# Patient Record
Sex: Female | Born: 1935 | Race: White | Hispanic: No | Smoking: Never smoker
Health system: Southern US, Community
[De-identification: ages and names within clinical notes are randomized; demographics above are authoritative.]

## PROBLEM LIST (undated history)

## (undated) DIAGNOSIS — M810 Age-related osteoporosis without current pathological fracture: Secondary | ICD-10-CM

## (undated) DIAGNOSIS — I5022 Chronic systolic (congestive) heart failure: Secondary | ICD-10-CM

## (undated) DIAGNOSIS — I10 Essential (primary) hypertension: Secondary | ICD-10-CM

## (undated) DIAGNOSIS — E785 Hyperlipidemia, unspecified: Secondary | ICD-10-CM

## (undated) HISTORY — DX: Age-related osteoporosis without current pathological fracture: M81.0

## (undated) HISTORY — DX: Essential (primary) hypertension: I10

## (undated) HISTORY — DX: Chronic systolic (congestive) heart failure: I50.22

## (undated) HISTORY — DX: Hyperlipidemia, unspecified: E78.5

---

## 2016-09-24 DIAGNOSIS — I10 Essential (primary) hypertension: Secondary | ICD-10-CM | POA: Diagnosis not present

## 2016-09-24 DIAGNOSIS — Z79899 Other long term (current) drug therapy: Secondary | ICD-10-CM | POA: Diagnosis not present

## 2017-02-08 DIAGNOSIS — H524 Presbyopia: Secondary | ICD-10-CM | POA: Diagnosis not present

## 2017-02-12 DIAGNOSIS — R600 Localized edema: Secondary | ICD-10-CM | POA: Diagnosis not present

## 2017-03-12 DIAGNOSIS — I10 Essential (primary) hypertension: Secondary | ICD-10-CM | POA: Diagnosis not present

## 2017-03-12 DIAGNOSIS — Z79899 Other long term (current) drug therapy: Secondary | ICD-10-CM | POA: Diagnosis not present

## 2017-03-12 DIAGNOSIS — R600 Localized edema: Secondary | ICD-10-CM | POA: Diagnosis not present

## 2017-03-25 ENCOUNTER — Other Ambulatory Visit: Payer: Self-pay | Admitting: Geriatric Medicine

## 2017-03-25 DIAGNOSIS — I509 Heart failure, unspecified: Secondary | ICD-10-CM

## 2017-03-25 DIAGNOSIS — I10 Essential (primary) hypertension: Secondary | ICD-10-CM | POA: Diagnosis not present

## 2017-03-25 DIAGNOSIS — Z79899 Other long term (current) drug therapy: Secondary | ICD-10-CM | POA: Diagnosis not present

## 2017-04-08 ENCOUNTER — Other Ambulatory Visit (HOSPITAL_COMMUNITY): Payer: Self-pay

## 2017-04-15 ENCOUNTER — Ambulatory Visit (HOSPITAL_COMMUNITY): Payer: Medicare HMO | Attending: Geriatric Medicine

## 2017-04-15 ENCOUNTER — Other Ambulatory Visit: Payer: Self-pay

## 2017-04-15 DIAGNOSIS — E785 Hyperlipidemia, unspecified: Secondary | ICD-10-CM | POA: Insufficient documentation

## 2017-04-15 DIAGNOSIS — I080 Rheumatic disorders of both mitral and aortic valves: Secondary | ICD-10-CM | POA: Insufficient documentation

## 2017-04-15 DIAGNOSIS — I110 Hypertensive heart disease with heart failure: Secondary | ICD-10-CM | POA: Diagnosis not present

## 2017-04-15 DIAGNOSIS — I509 Heart failure, unspecified: Secondary | ICD-10-CM | POA: Diagnosis present

## 2017-05-03 DIAGNOSIS — Z23 Encounter for immunization: Secondary | ICD-10-CM | POA: Diagnosis not present

## 2017-05-03 DIAGNOSIS — I10 Essential (primary) hypertension: Secondary | ICD-10-CM | POA: Diagnosis not present

## 2017-05-03 DIAGNOSIS — I272 Pulmonary hypertension, unspecified: Secondary | ICD-10-CM | POA: Diagnosis not present

## 2017-05-03 DIAGNOSIS — Z1389 Encounter for screening for other disorder: Secondary | ICD-10-CM | POA: Diagnosis not present

## 2017-05-03 DIAGNOSIS — H6123 Impacted cerumen, bilateral: Secondary | ICD-10-CM | POA: Diagnosis not present

## 2017-05-03 DIAGNOSIS — Z0000 Encounter for general adult medical examination without abnormal findings: Secondary | ICD-10-CM | POA: Diagnosis not present

## 2017-05-03 DIAGNOSIS — Z79899 Other long term (current) drug therapy: Secondary | ICD-10-CM | POA: Diagnosis not present

## 2017-05-03 DIAGNOSIS — I5022 Chronic systolic (congestive) heart failure: Secondary | ICD-10-CM | POA: Diagnosis not present

## 2017-05-03 DIAGNOSIS — B351 Tinea unguium: Secondary | ICD-10-CM | POA: Diagnosis not present

## 2017-05-04 ENCOUNTER — Telehealth: Payer: Self-pay | Admitting: Cardiology

## 2017-05-04 NOTE — Telephone Encounter (Signed)
Received records from [facility] for appointment on 05/13/17 with Kerin Ransom, [place].  Records put with Luke's schedule for 05/13/17. lp

## 2017-05-10 ENCOUNTER — Other Ambulatory Visit: Payer: Self-pay

## 2017-05-10 DIAGNOSIS — E785 Hyperlipidemia, unspecified: Secondary | ICD-10-CM | POA: Insufficient documentation

## 2017-05-10 DIAGNOSIS — I10 Essential (primary) hypertension: Secondary | ICD-10-CM | POA: Insufficient documentation

## 2017-05-10 DIAGNOSIS — I5022 Chronic systolic (congestive) heart failure: Secondary | ICD-10-CM | POA: Insufficient documentation

## 2017-05-10 DIAGNOSIS — E7800 Pure hypercholesterolemia, unspecified: Secondary | ICD-10-CM

## 2017-05-13 ENCOUNTER — Ambulatory Visit (INDEPENDENT_AMBULATORY_CARE_PROVIDER_SITE_OTHER): Payer: Medicare HMO | Admitting: Cardiology

## 2017-05-13 ENCOUNTER — Encounter: Payer: Self-pay | Admitting: Cardiology

## 2017-05-13 VITALS — BP 180/74 | HR 81 | Ht 61.5 in | Wt 99.2 lb

## 2017-05-13 DIAGNOSIS — I5022 Chronic systolic (congestive) heart failure: Secondary | ICD-10-CM | POA: Diagnosis not present

## 2017-05-13 DIAGNOSIS — Z79899 Other long term (current) drug therapy: Secondary | ICD-10-CM | POA: Diagnosis not present

## 2017-05-13 DIAGNOSIS — I10 Essential (primary) hypertension: Secondary | ICD-10-CM | POA: Diagnosis not present

## 2017-05-13 LAB — BASIC METABOLIC PANEL
BUN/Creatinine Ratio: 12 (ref 12–28)
BUN: 15 mg/dL (ref 8–27)
CO2: 27 mmol/L (ref 20–29)
Calcium: 9.6 mg/dL (ref 8.7–10.3)
Chloride: 97 mmol/L (ref 96–106)
Creatinine, Ser: 1.24 mg/dL — ABNORMAL HIGH (ref 0.57–1.00)
GFR calc Af Amer: 45 mL/min/{1.73_m2} — ABNORMAL LOW (ref >59)
GFR calc non Af Amer: 39 mL/min/{1.73_m2} — ABNORMAL LOW (ref >59)
Glucose: 105 mg/dL — ABNORMAL HIGH (ref 65–99)
Potassium: 4.9 mmol/L (ref 3.5–5.2)
Sodium: 139 mmol/L (ref 134–144)

## 2017-05-13 MED ORDER — CARVEDILOL 3.125 MG PO TABS
3.1250 mg | ORAL_TABLET | Freq: Two times a day (BID) | ORAL | 3 refills | Status: DC
Start: 2017-05-13 — End: 2018-04-30

## 2017-05-13 NOTE — Assessment & Plan Note (Signed)
Needs better control

## 2017-05-13 NOTE — Patient Instructions (Addendum)
Medication Instructions: START Carvedilol 3.125 mg tablet two times daily.  Please have the following lab drawn today: BMET   Procedures/Testing: Your physician has requested that you have an echocardiogram. Echocardiography is a painless test that uses sound waves to create images of your heart. It provides your doctor with information about the size and shape of your heart and how well your heart's chambers and valves are working. This procedure takes approximately one hour. There are no restrictions for this procedure. This will be done at [address]   Follow-Up: Your physician wants you to follow-up in: 3 months with Dr. Debara Pickett (after the echo has been done.) You will receive a reminder letter in the mail two months in advance. If you don't receive a letter, please call our office to schedule the follow-up appointment.    If you need a refill on your cardiac medications before your next appointment, please call your pharmacy.

## 2017-05-13 NOTE — Progress Notes (Signed)
05/13/2017 Dawn Burke   06-10-1926  [id number]  Primary Physician Lajean Manes, MD Primary Cardiologist: Dr Debara Pickett (new)  HPI:  Pleasant 83 y/o female here with her daughter as an referral from Dr Felipa Eth for a new diagnosis of CHF. The pt's PMH is remarkable for a lack of significant medical problems. She has had no surgery and denies any medical problems besides a history of HTN. She moved to this area in 2014 from [place]. She lives in a senior apartment in a retirement community. She had complianed of swelling in her legs a few months ago. She was placed on Lasix 20 mg which helped. An echo was done 04/16/17 which revealed an EF of 30-35% with moderate AR, mild LEA, and global HK. She denies any chest pain, DOE, orthopnea, or PND.    Current Outpatient Prescriptions  Medication Sig Dispense Refill  . aspirin EC 81 MG tablet Take 81 mg by mouth daily.    . furosemide (LASIX) 20 MG tablet Take 20 mg by mouth daily.    . sacubitril-valsartan (ENTRESTO) 24-26 MG Take 1 tablet by mouth 2 (two) times daily.    . carvedilol (COREG) 3.125 MG tablet Take 1 tablet (3.125 mg total) by mouth 2 (two) times daily. 180 tablet 3   No current facility-administered medications for this visit.     No Known Allergies  Past Medical History:  Diagnosis Date  . Chronic systolic CHF (congestive heart failure) ([place])   . Hyperlipidemia   . Hypertension   . Osteoporosis     Social History   Social History  . Marital status: Widowed    Spouse name: N/A  . Number of children: N/A  . Years of education: N/A   Occupational History  . Not on file.   Social History Main Topics  . Smoking status: Never Smoker  . Smokeless tobacco: Never Used  . Alcohol use Yes  . Drug use: Unknown  . Sexual activity: Not on file   Other Topics Concern  . Not on file   Social History Narrative   Single, widowed in 2002. Unemployed, home maker.    3 children. Pt moved from [place] in 2014.      Family History  Problem Relation Age of Onset  . Diabetes Mother   . CAD Father   . CAD Brother   . Diabetes Brother      Review of Systems: General: negative for chills, fever, night sweats or weight changes.  Cardiovascular: negative for chest pain, dyspnea on exertion, orthopnea, palpitations, paroxysmal nocturnal dyspnea or shortness of breath Dermatological: negative for rash Respiratory: negative for cough or wheezing Urologic: negative for hematuria Abdominal: negative for nausea, vomiting, diarrhea, bright red blood per rectum, melena, or hematemesis Neurologic: negative for visual changes, syncope, or dizziness All other systems reviewed and are otherwise negative except as noted above.    Blood pressure (!) 180/74, pulse 81, height 5' 1.5" (1.562 m), weight 99 lb 3.2 oz (45 kg).  General appearance: alert, cooperative, appears stated age and no distress Neck: no carotid bruit and no JVD Lungs: clear to auscultation bilaterally Heart: regular rate and rhythm Abdomen: soft, non-tender; bowel sounds normal; no masses,  no organomegaly Extremities: trace LE edema Pulses: 2+ and symmetric Skin: Skin color, texture, turgor normal. No rashes or lesions Neurologic: Grossly normal  EKG NSR, LVH with repol changes  ASSESSMENT AND PLAN:   Chronic systolic (congestive) heart failure (HCC) Pt noted to have LE edema, echo done 04/15/17  showed her EF to be 30-35% with global HK Currently stable on Lasix and Entresto  Essential hypertension Needs better control   PLAN  I suspect her CM is from uncontrolled HTN. Discussed with Dr Debara Pickett in the office today. Dr Felipa Eth has added Entresto at his LOV. She is stable from a heart failure standpoint. There is no history of angina. For now we will continue medical Rx. Add Coreg 3.125 mg BID. Check BMP today (K+ was 5.6 on 05/04/17 labs). We will check an echo in 3 months- if no improvement will discuss ischemic work up then. I  discussed low sodium diet- pt admits she loves hot dogs and pretzels.   Kerin Ransom PA-C 05/13/2017 9:52 AM

## 2017-05-13 NOTE — Assessment & Plan Note (Signed)
Pt noted to have LE edema, echo done 04/15/17 showed her EF to be 30-35% with global HK Currently stable on Lasix and Entresto

## 2017-05-24 ENCOUNTER — Ambulatory Visit (INDEPENDENT_AMBULATORY_CARE_PROVIDER_SITE_OTHER): Payer: Medicare HMO | Admitting: Podiatry

## 2017-05-24 ENCOUNTER — Encounter: Payer: Self-pay | Admitting: Podiatry

## 2017-05-24 DIAGNOSIS — M79675 Pain in left toe(s): Secondary | ICD-10-CM

## 2017-05-24 DIAGNOSIS — M79674 Pain in right toe(s): Secondary | ICD-10-CM

## 2017-05-24 DIAGNOSIS — B351 Tinea unguium: Secondary | ICD-10-CM | POA: Diagnosis not present

## 2017-05-24 NOTE — Progress Notes (Signed)
   Subjective:    Patient ID: Dawn Burke, female    DOB: [birth year], 83 y.o.   MRN: [id number]  HPI  Chief Complaint  Patient presents with  . Nail Problem    trim nails   Dawn Burke Presents the office if her concerns of very elongated, thick toenails that have not been trimmed and a couple years. She is unsure of last time she had her nails trimmed. Nails are very long and they're causing pressure and she cannot wear shoes because of this. Denies any redness or drainage or any swelling. She denies any open sores. She has no other concerns today.     Review of Systems  Cardiovascular: Positive for leg swelling.  Musculoskeletal: Positive for back pain.  All other systems reviewed and are negative.      Objective:   Physical Exam  General: NAD  Dermatological: Nails are hypertrophic, dystrophic, brittle, discolored, elongated 10. The nails are very elongated and curving in on themselves and the other nails. No surrounding redness or drainage. Tenderness nails 1-5 bilaterally. No open lesions or pre-ulcerative lesions are identified today.  Vascular: Dorsalis Pedis artery and Posterior Tibial artery pedal pulses are 2/4 bilateral with immedate capillary fill time. There is no pain with calf compression, swelling, warmth, erythema.   Neruologic: Grossly intact via light touch bilateral.  Protective threshold with Semmes Wienstein monofilament intact to all pedal sites bilateral.   Musculoskeletal: No gross boney pedal deformities bilateral. No pain, crepitus, or limitation noted with foot and ankle range of motion bilateral. Muscular strength 5/5 in all groups tested bilateral.  Gait: Unassisted, Nonantalgic.      Assessment & Plan:  83 year old female with significant symptomatic onychomycosis -Treatment options discussed including all alternatives, risks, and complications -Etiology of symptoms were discussed -Nails debrided 10 without complications or bleeding. -Daily  foot inspection -Follow-up in 3 months or sooner if any problems arise. In the meantime, encouraged to call the office with any questions, concerns, change in symptoms.   Celesta Gentile, DPM

## 2017-05-26 ENCOUNTER — Other Ambulatory Visit (HOSPITAL_COMMUNITY): Payer: Medicare HMO

## 2017-06-07 DIAGNOSIS — Z79899 Other long term (current) drug therapy: Secondary | ICD-10-CM | POA: Diagnosis not present

## 2017-06-07 DIAGNOSIS — I10 Essential (primary) hypertension: Secondary | ICD-10-CM | POA: Diagnosis not present

## 2017-06-07 DIAGNOSIS — I5022 Chronic systolic (congestive) heart failure: Secondary | ICD-10-CM | POA: Diagnosis not present

## 2017-07-19 DIAGNOSIS — I272 Pulmonary hypertension, unspecified: Secondary | ICD-10-CM | POA: Diagnosis not present

## 2017-07-19 DIAGNOSIS — Z23 Encounter for immunization: Secondary | ICD-10-CM | POA: Diagnosis not present

## 2017-07-19 DIAGNOSIS — Z79899 Other long term (current) drug therapy: Secondary | ICD-10-CM | POA: Diagnosis not present

## 2017-07-19 DIAGNOSIS — I5022 Chronic systolic (congestive) heart failure: Secondary | ICD-10-CM | POA: Diagnosis not present

## 2017-07-19 DIAGNOSIS — I10 Essential (primary) hypertension: Secondary | ICD-10-CM | POA: Diagnosis not present

## 2017-07-26 ENCOUNTER — Ambulatory Visit (HOSPITAL_COMMUNITY): Payer: Medicare HMO

## 2017-08-11 ENCOUNTER — Other Ambulatory Visit: Payer: Self-pay

## 2017-08-11 ENCOUNTER — Ambulatory Visit (HOSPITAL_COMMUNITY): Payer: Medicare HMO | Attending: Cardiology

## 2017-08-11 DIAGNOSIS — I110 Hypertensive heart disease with heart failure: Secondary | ICD-10-CM | POA: Diagnosis not present

## 2017-08-11 DIAGNOSIS — I5022 Chronic systolic (congestive) heart failure: Secondary | ICD-10-CM | POA: Insufficient documentation

## 2017-08-11 DIAGNOSIS — I083 Combined rheumatic disorders of mitral, aortic and tricuspid valves: Secondary | ICD-10-CM | POA: Diagnosis not present

## 2017-08-11 DIAGNOSIS — E785 Hyperlipidemia, unspecified: Secondary | ICD-10-CM | POA: Diagnosis not present

## 2017-08-18 ENCOUNTER — Encounter: Payer: Self-pay | Admitting: Internal Medicine

## 2017-08-18 ENCOUNTER — Ambulatory Visit (INDEPENDENT_AMBULATORY_CARE_PROVIDER_SITE_OTHER): Payer: Medicare HMO | Admitting: Internal Medicine

## 2017-08-18 VITALS — BP 134/86 | HR 80 | Ht 62.0 in | Wt 102.0 lb

## 2017-08-18 DIAGNOSIS — I5022 Chronic systolic (congestive) heart failure: Secondary | ICD-10-CM

## 2017-08-18 DIAGNOSIS — I10 Essential (primary) hypertension: Secondary | ICD-10-CM

## 2017-08-18 DIAGNOSIS — E785 Hyperlipidemia, unspecified: Secondary | ICD-10-CM | POA: Diagnosis not present

## 2017-08-18 NOTE — Patient Instructions (Signed)
Your physician wants you to follow-up in: 6 months with [place], [place] and 12 months with Dr. Debara Pickett. You will receive a reminder letter in the mail two months in advance. If you don't receive a letter, please call our office to schedule the follow-up appointment.

## 2017-08-18 NOTE — Progress Notes (Signed)
OFFICE NOTE  Chief Complaint:  Follow-up heart failure  Primary Care Physician: Lajean Manes, MD  HPI:  Dawn Burke is a 83 y.o. female with a past medial history significant for congestive heart failure. It is not known whether this is ischemic or nonischemic. She moved with her family to [place] in 2014 from [place]. She lives in a retirement community. In June 2018 and echo showed an EF of 30-35% with moderate aortic insufficiency and global hypokinesis. She was started on medical therapy for that and saw Kerin Ransom, PA-C. He added carvedilol and she is currently on the moderate dose of Entresto. Subsequently she underwent a repeat echo on 08/11/2017 which showed improvement in LVEF to 45-50%. Unfortunate she also has some dementia and does not have much recollection of her symptoms. Overall she says she feels fine when queried about a number of medical problems. She denies shortness of breath or chest pain. She is not very physically active. Overall she is without complaints.  PMHx:  Past Medical History:  Diagnosis Date  . Chronic systolic CHF (congestive heart failure) ([place])   . Hyperlipidemia   . Hypertension   . Osteoporosis     History reviewed. No pertinent surgical history.  FAMHx:  Family History  Problem Relation Age of Onset  . Diabetes Mother   . CAD Father   . CAD Brother   . Diabetes Brother     SOCHx:   reports that she has never smoked. She has never used smokeless tobacco. She reports that she drinks alcohol. Her drug history is not on Burke.  ALLERGIES:  No Known Allergies  ROS: Pertinent items noted in HPI and remainder of comprehensive ROS otherwise negative.  HOME MEDS: Current Outpatient Prescriptions on Burke Prior to Visit  Medication Sig Dispense Refill  . aspirin EC 81 MG tablet Take 81 mg by mouth daily.    . furosemide (LASIX) 20 MG tablet Take 20 mg by mouth daily.    . carvedilol (COREG) 3.125 MG tablet Take 1 tablet (3.125  mg total) by mouth 2 (two) times daily. 180 tablet 3   No current facility-administered medications on Burke prior to visit.     LABS/IMAGING: No results found for this or any previous visit (from the past 48 hour(s)). No results found.  LIPID PANEL: No results found for: CHOL, TRIG, HDL, CHOLHDL, VLDL, LDLCALC, LDLDIRECT   WEIGHTS: Wt Readings from Last 3 Encounters:  08/18/17 102 lb (46.3 kg)  05/13/17 99 lb 3.2 oz (45 kg)    VITALS: BP 134/86   Pulse 80   Ht 5\' 2"  (1.575 m)   Wt 102 lb (46.3 kg)   BMI 18.66 kg/m   EXAM: General appearance: alert and no distress Neck: no carotid bruit, no JVD and thyroid not enlarged, symmetric, no tenderness/mass/nodules Lungs: clear to auscultation bilaterally Heart: regular rate and rhythm, S1, S2 normal and diastolic murmur: early diastolic 2/6, crescendo at 2nd right intercostal space Abdomen: soft, non-tender; bowel sounds normal; no masses,  no organomegaly Extremities: extremities normal, atraumatic, no cyanosis or edema Pulses: 2+ and symmetric Skin: Skin color, texture, turgor normal. No rashes or lesions Neurologic: Grossly normal Psych: Pleasant  EKG: Normal sinus rhythm at 80, moderate voltage criteria for LVH, nonspecific T-wave changes - personally reviewed  ASSESSMENT: 1. Systolic congestive heart failure, LVEF 30-35% (improved to 45-50%) 2. Hyperlipidemia 3. Hypertension 4. Memory loss  PLAN: 1.   Dawn Burke 83 years old and has some mentioned. Overall though she  does pretty well and is generally asymptomatic. She was found to have an EF of 30-35% however is on guideline directed therapy for congestive heart failure and has had improvement in LVEF of 45-50%. She cannot recall any difference in her symptoms with this therapy however has had significant improvement and seems to be tolerating her medications. I continue those and if there is room consider increasing her Entresto to the max dose.  Follow-up with Kerin Ransom, PA-C-C in 6 months and me in one year per  Pixie Casino, MD, [place]  Attending Cardiologist  Direct Dial: [phone]  Fax: [phone]  Website:  [email or web address] Dawn Burke 08/18/2017, 10:57 AM

## 2017-08-23 ENCOUNTER — Encounter: Payer: Self-pay | Admitting: Podiatry

## 2017-08-23 ENCOUNTER — Ambulatory Visit (INDEPENDENT_AMBULATORY_CARE_PROVIDER_SITE_OTHER): Payer: Medicare HMO | Admitting: Podiatry

## 2017-08-23 DIAGNOSIS — M79675 Pain in left toe(s): Secondary | ICD-10-CM | POA: Diagnosis not present

## 2017-08-23 DIAGNOSIS — L02611 Cutaneous abscess of right foot: Secondary | ICD-10-CM | POA: Diagnosis not present

## 2017-08-23 DIAGNOSIS — M79674 Pain in right toe(s): Secondary | ICD-10-CM

## 2017-08-23 DIAGNOSIS — B351 Tinea unguium: Secondary | ICD-10-CM

## 2017-08-23 DIAGNOSIS — L84 Corns and callosities: Secondary | ICD-10-CM | POA: Diagnosis not present

## 2017-08-23 NOTE — Progress Notes (Signed)
Subjective: 83 y.o. returns the office today for painful, elongated, thickened toenails which she cannot trim herself. Denies any redness or drainage around the nails. She also has a callus to her right foot in the ball of her foot. Denies any acute changes since last appointment and no new complaints today. Denies any systemic complaints such as fevers, chills, nausea, vomiting.   Objective: AAO 3, NAD DP/PT pulses palpable, CRT less than 3 seconds Nails hypertrophic, dystrophic, elongated, brittle, discolored 10. There is tenderness overlying the nails 1-5 bilaterally. There is no surrounding erythema or drainage along the nail sites. Right foot second metatarsal for the hyperkeratotic lesion with what appears to be a small little blister around the area. During debridement there is a very small amount of purulence expressed after debridement there is no underlying drainage and stability a superficial abscess. Very small superficial abrasion type wound is present underneath the callus measuring 0.2 x 0.17 m and is no probing, undermining or tunneling. There is no surrounding erythema, ascending cellulitis. There is no fluctuance or crepitus. There is no malodor. No open lesions or pre-ulcerative lesions are identified. No other areas of tenderness bilateral lower extremities. No overlying edema, erythema, increased warmth. No pain with calf compression, swelling, warmth, erythema.  Assessment: Patient presents with symptomatic onychomycosis superficial abscess under callus right foot;   Plan: -Treatment options including alternatives, risks, complications were discussed -Nails sharply debrided 10 without complication/bleeding. -Hyperkeratotic lesion right foot was sharply debrided to reveal small superficial area of purulence. After debridement there was no further area of infection present. Antibiotic ointment and a bandage was applied and recommended her to continue with this as well. I will  see her back in 2 weeks or sooner if any issues are to arise.  Monitor for any clinical signs or symptoms of infection and directed to call the office immediately should any occur or go to the ER. -Discussed daily foot inspection. If there are any changes, to call the office immediately.  -Follow-up in 3 months for routine care or sooner if any problems are to arise. In the meantime, encouraged to call the office with any questions, concerns, changes symptoms.  Celesta Gentile, DPM

## 2017-08-31 DIAGNOSIS — I10 Essential (primary) hypertension: Secondary | ICD-10-CM | POA: Diagnosis not present

## 2017-08-31 DIAGNOSIS — I5022 Chronic systolic (congestive) heart failure: Secondary | ICD-10-CM | POA: Diagnosis not present

## 2017-08-31 DIAGNOSIS — Z79899 Other long term (current) drug therapy: Secondary | ICD-10-CM | POA: Diagnosis not present

## 2017-09-06 ENCOUNTER — Ambulatory Visit: Payer: Medicare HMO | Admitting: Podiatry

## 2017-10-05 ENCOUNTER — Other Ambulatory Visit: Payer: Self-pay | Admitting: Geriatric Medicine

## 2017-10-05 DIAGNOSIS — F0390 Unspecified dementia without behavioral disturbance: Secondary | ICD-10-CM

## 2017-10-05 DIAGNOSIS — I10 Essential (primary) hypertension: Secondary | ICD-10-CM | POA: Diagnosis not present

## 2017-10-05 DIAGNOSIS — G301 Alzheimer's disease with late onset: Secondary | ICD-10-CM | POA: Diagnosis not present

## 2017-10-05 DIAGNOSIS — R69 Illness, unspecified: Secondary | ICD-10-CM | POA: Diagnosis not present

## 2017-10-05 DIAGNOSIS — Z79899 Other long term (current) drug therapy: Secondary | ICD-10-CM | POA: Diagnosis not present

## 2017-10-05 DIAGNOSIS — I5022 Chronic systolic (congestive) heart failure: Secondary | ICD-10-CM | POA: Diagnosis not present

## 2017-10-12 ENCOUNTER — Ambulatory Visit
Admission: RE | Admit: 2017-10-12 | Discharge: 2017-10-12 | Disposition: A | Discharge status: Home / Self Care | Payer: Medicare HMO | Source: Ambulatory Visit | Attending: Geriatric Medicine | Admitting: Geriatric Medicine

## 2017-10-12 DIAGNOSIS — F0390 Unspecified dementia without behavioral disturbance: Secondary | ICD-10-CM

## 2017-10-12 DIAGNOSIS — R410 Disorientation, unspecified: Secondary | ICD-10-CM | POA: Diagnosis not present

## 2017-11-29 ENCOUNTER — Ambulatory Visit: Payer: Medicare HMO | Admitting: Podiatry

## 2017-11-29 DIAGNOSIS — M79675 Pain in left toe(s): Secondary | ICD-10-CM | POA: Diagnosis not present

## 2017-11-29 DIAGNOSIS — M79674 Pain in right toe(s): Secondary | ICD-10-CM | POA: Diagnosis not present

## 2017-11-29 DIAGNOSIS — B351 Tinea unguium: Secondary | ICD-10-CM | POA: Diagnosis not present

## 2017-11-30 NOTE — Progress Notes (Signed)
Subjective: 84 y.o. returns the office today for painful, elongated, thickened toenails which she cannot trim herself. Denies any redness or drainage around the nails.  She states the callus in the right foot looks better he does not need to be trimmed today.  Denies any swelling redness or drainage. Denies any acute changes since last appointment and no new complaints today. Denies any systemic complaints such as fevers, chills, nausea, vomiting.   Objective: AAO 3, NAD DP/PT pulses palpable, CRT less than 3 seconds Nails hypertrophic, dystrophic, elongated, brittle, discolored 10. There is tenderness overlying the nails 1-5 bilaterally. There is no surrounding erythema or drainage along the nail sites. Minimal hyperkeratotic tissue right submetatarsal area.  She does not with the area trimmed today.  No surrounding edema, erythema, drainage or pus or any signs of infection noted today.   No open lesions or other pre-ulcerative lesions are identified. No other areas of tenderness bilateral lower extremities. No overlying edema, erythema, increased warmth. No pain with calf compression, swelling, warmth, erythema.  Assessment: Patient presents with symptomatic onychomycosis  Plan: -Treatment options including alternatives, risks, complications were discussed -Nails sharply debrided 10 without complication/bleeding. -Discussed daily foot inspection. If there are any changes, to call the office immediately.  -Follow-up in 3 months for routine care or sooner if any problems are to arise. In the meantime, encouraged to call the office with any questions, concerns, changes symptoms.  Celesta Gentile, DPM

## 2017-12-31 DIAGNOSIS — N183 Chronic kidney disease, stage 3 (moderate): Secondary | ICD-10-CM | POA: Diagnosis not present

## 2017-12-31 DIAGNOSIS — G301 Alzheimer's disease with late onset: Secondary | ICD-10-CM | POA: Diagnosis not present

## 2017-12-31 DIAGNOSIS — I129 Hypertensive chronic kidney disease with stage 1 through stage 4 chronic kidney disease, or unspecified chronic kidney disease: Secondary | ICD-10-CM | POA: Diagnosis not present

## 2017-12-31 DIAGNOSIS — Z79899 Other long term (current) drug therapy: Secondary | ICD-10-CM | POA: Diagnosis not present

## 2017-12-31 DIAGNOSIS — I5022 Chronic systolic (congestive) heart failure: Secondary | ICD-10-CM | POA: Diagnosis not present

## 2018-02-28 ENCOUNTER — Ambulatory Visit: Payer: Medicare HMO | Admitting: Podiatry

## 2018-03-01 ENCOUNTER — Ambulatory Visit: Payer: Medicare HMO | Admitting: Podiatry

## 2018-03-01 DIAGNOSIS — B351 Tinea unguium: Secondary | ICD-10-CM

## 2018-03-01 DIAGNOSIS — M79675 Pain in left toe(s): Secondary | ICD-10-CM | POA: Diagnosis not present

## 2018-03-01 DIAGNOSIS — L84 Corns and callosities: Secondary | ICD-10-CM

## 2018-03-01 DIAGNOSIS — M79674 Pain in right toe(s): Secondary | ICD-10-CM

## 2018-03-02 NOTE — Progress Notes (Signed)
Subjective: 84 y.o. returns the office today for painful, elongated, thickened toenails which she cannot trim herself. Denies any redness or drainage around the nails. Denies any swelling redness or drainage. Denies any acute changes since last appointment and no new complaints today. Denies any systemic complaints such as fevers, chills, nausea, vomiting.   Objective: AAO 3, NAD DP/PT pulses palpable, CRT less than 3 seconds Nails hypertrophic, dystrophic, elongated, brittle, discolored 10. There is tenderness overlying the nails 1-5 bilaterally. There is no surrounding erythema or drainage along the nail sites. Minimal hyperkeratotic tissue right submetatarsal area.   No surrounding edema, erythema, drainage or pus or any signs of infection noted today.   No open lesions or other pre-ulcerative lesions are identified. No other areas of tenderness bilateral lower extremities. No overlying edema, erythema, increased warmth. No pain with calf compression, swelling, warmth, erythema.  Assessment: Patient presents with symptomatic onychomycosis  Plan: -Treatment options including alternatives, risks, complications were discussed -Nails sharply debrided 10 without complication/bleeding. -Lightly debrided the hyperkeratotic lesion to the any complications or bleeding and I only removed the very superficial area at her request and I did not go deep with it. -Discussed daily foot inspection. If there are any changes, to call the office immediately.  -Follow-up in 3 months for routine care or sooner if any problems are to arise. In the meantime, encouraged to call the office with any questions, concerns, changes symptoms.  Celesta Gentile, DPM

## 2018-04-30 ENCOUNTER — Other Ambulatory Visit: Payer: Self-pay | Admitting: Cardiology

## 2018-05-10 DIAGNOSIS — I129 Hypertensive chronic kidney disease with stage 1 through stage 4 chronic kidney disease, or unspecified chronic kidney disease: Secondary | ICD-10-CM | POA: Diagnosis not present

## 2018-05-10 DIAGNOSIS — Z23 Encounter for immunization: Secondary | ICD-10-CM | POA: Diagnosis not present

## 2018-05-10 DIAGNOSIS — Z1389 Encounter for screening for other disorder: Secondary | ICD-10-CM | POA: Diagnosis not present

## 2018-05-10 DIAGNOSIS — G301 Alzheimer's disease with late onset: Secondary | ICD-10-CM | POA: Diagnosis not present

## 2018-05-10 DIAGNOSIS — R739 Hyperglycemia, unspecified: Secondary | ICD-10-CM | POA: Diagnosis not present

## 2018-05-10 DIAGNOSIS — I10 Essential (primary) hypertension: Secondary | ICD-10-CM | POA: Diagnosis not present

## 2018-05-10 DIAGNOSIS — N183 Chronic kidney disease, stage 3 (moderate): Secondary | ICD-10-CM | POA: Diagnosis not present

## 2018-05-10 DIAGNOSIS — H6120 Impacted cerumen, unspecified ear: Secondary | ICD-10-CM | POA: Diagnosis not present

## 2018-05-10 DIAGNOSIS — Z79899 Other long term (current) drug therapy: Secondary | ICD-10-CM | POA: Diagnosis not present

## 2018-05-10 DIAGNOSIS — I5022 Chronic systolic (congestive) heart failure: Secondary | ICD-10-CM | POA: Diagnosis not present

## 2018-05-10 DIAGNOSIS — Z0000 Encounter for general adult medical examination without abnormal findings: Secondary | ICD-10-CM | POA: Diagnosis not present

## 2018-05-25 DIAGNOSIS — H6123 Impacted cerumen, bilateral: Secondary | ICD-10-CM | POA: Diagnosis not present

## 2018-06-02 ENCOUNTER — Ambulatory Visit: Payer: Medicare HMO | Admitting: Podiatry

## 2018-06-02 DIAGNOSIS — Q828 Other specified congenital malformations of skin: Secondary | ICD-10-CM | POA: Diagnosis not present

## 2018-06-02 DIAGNOSIS — M79674 Pain in right toe(s): Secondary | ICD-10-CM | POA: Diagnosis not present

## 2018-06-02 DIAGNOSIS — M79675 Pain in left toe(s): Secondary | ICD-10-CM | POA: Diagnosis not present

## 2018-06-02 DIAGNOSIS — B351 Tinea unguium: Secondary | ICD-10-CM

## 2018-06-02 NOTE — Progress Notes (Signed)
Subjective: 84 y.o. returns the office today for painful, elongated, thickened toenails which she cannot trim herself. Denies any redness or drainage around the nails. Denies any swelling redness or drainage. Also has calluses on the right foot in the ball and denies any redness, drainage, or signs of infection. Denies any acute changes since last appointment and no new complaints today. Denies any systemic complaints such as fevers, chills, nausea, vomiting.   Objective: AAO 3, NAD DP/PT pulses palpable, CRT less than 3 seconds Nails hypertrophic, dystrophic, elongated, brittle, discolored 10. There is tenderness overlying the nails 1-5 bilaterally. There is no surrounding erythema or drainage along the nail sites. Minimal hyperkeratotic tissue right submetatarsal 2 and 3.   No surrounding edema, erythema, drainage or pus or any signs of infection noted today.   No open lesions or other pre-ulcerative lesions are identified. No other areas of tenderness bilateral lower extremities. No overlying edema, erythema, increased warmth. No pain with calf compression, swelling, warmth, erythema.  Assessment: Patient presents with symptomatic onychomycosis  Plan: -Treatment options including alternatives, risks, complications were discussed -Nails sharply debrided 10 without complication/bleeding. -Debrided the hyperkeratotic lesion to the any complications or bleeding x 2.  -Discussed daily foot inspection. If there are any changes, to call the office immediately.  -Follow-up in 3 months for routine care or sooner if any problems are to arise. In the meantime, encouraged to call the office with any questions, concerns, changes symptoms.  Celesta Gentile, DPM

## 2018-09-02 ENCOUNTER — Ambulatory Visit: Payer: Medicare HMO | Admitting: Podiatry

## 2018-09-02 ENCOUNTER — Encounter: Payer: Self-pay | Admitting: Podiatry

## 2018-09-02 VITALS — BP 142/112 | HR 83 | Resp 16

## 2018-09-02 DIAGNOSIS — Q828 Other specified congenital malformations of skin: Secondary | ICD-10-CM

## 2018-09-02 DIAGNOSIS — M79674 Pain in right toe(s): Secondary | ICD-10-CM | POA: Diagnosis not present

## 2018-09-02 DIAGNOSIS — B351 Tinea unguium: Secondary | ICD-10-CM | POA: Diagnosis not present

## 2018-09-02 DIAGNOSIS — M79675 Pain in left toe(s): Secondary | ICD-10-CM | POA: Diagnosis not present

## 2018-09-04 NOTE — Progress Notes (Signed)
Subjective: 84 y.o. returns the office today for painful, elongated, thickened toenails which she cannot trim herself as well as for calluses. Denies any redness or drainage around the nails/callus is. Denies any swelling redness or drainage.  Denies any acute changes since last appointment and no new complaints today. Denies any systemic complaints such as fevers, chills, nausea, vomiting.   Objective: AAO 3, NAD DP/PT pulses palpable, CRT less than 3 seconds Nails hypertrophic, dystrophic, elongated, brittle, discolored 10. There is tenderness overlying the nails 1-5 bilaterally. There is no surrounding erythema or drainage along the nail sites. Minimal hyperkeratotic tissue right submetatarsal 2 and 3.   No surrounding edema, erythema, drainage or pus or any signs of infection noted today.   No open lesions or other pre-ulcerative lesions are identified. No pain with calf compression, swelling, warmth, erythema. Overall, no significant change  Assessment: Patient presents with symptomatic onychomycosis, hyperkeratotic lesions  Plan: -Treatment options including alternatives, risks, complications were discussed -Nails sharply debrided 10 without complication/bleeding. -Debrided the hyperkeratotic lesion to the any complications or bleeding x 2.  -Discussed daily foot inspection. If there are any changes, to call the office immediately.  -Follow-up in 3 months for routine care or sooner if any problems are to arise. In the meantime, encouraged to call the office with any questions, concerns, changes symptoms.  Celesta Gentile, DPM

## 2018-11-18 DIAGNOSIS — G301 Alzheimer's disease with late onset: Secondary | ICD-10-CM | POA: Diagnosis not present

## 2018-11-18 DIAGNOSIS — Z23 Encounter for immunization: Secondary | ICD-10-CM | POA: Diagnosis not present

## 2018-11-18 DIAGNOSIS — N184 Chronic kidney disease, stage 4 (severe): Secondary | ICD-10-CM | POA: Diagnosis not present

## 2018-11-18 DIAGNOSIS — I129 Hypertensive chronic kidney disease with stage 1 through stage 4 chronic kidney disease, or unspecified chronic kidney disease: Secondary | ICD-10-CM | POA: Diagnosis not present

## 2018-11-18 DIAGNOSIS — N183 Chronic kidney disease, stage 3 (moderate): Secondary | ICD-10-CM | POA: Diagnosis not present

## 2018-11-18 DIAGNOSIS — I5022 Chronic systolic (congestive) heart failure: Secondary | ICD-10-CM | POA: Diagnosis not present

## 2018-12-02 ENCOUNTER — Encounter: Payer: Self-pay | Admitting: Podiatry

## 2018-12-02 ENCOUNTER — Ambulatory Visit: Payer: Medicare HMO | Admitting: Podiatry

## 2018-12-02 DIAGNOSIS — Q828 Other specified congenital malformations of skin: Secondary | ICD-10-CM

## 2018-12-02 DIAGNOSIS — M79675 Pain in left toe(s): Secondary | ICD-10-CM | POA: Diagnosis not present

## 2018-12-02 DIAGNOSIS — M79674 Pain in right toe(s): Secondary | ICD-10-CM | POA: Diagnosis not present

## 2018-12-02 DIAGNOSIS — B351 Tinea unguium: Secondary | ICD-10-CM | POA: Diagnosis not present

## 2018-12-02 NOTE — Progress Notes (Signed)
Subjective: 85 y.o. returns the office today for painful, elongated, thickened toenails which she cannot trim herself as well as for calluses. Denies any redness or drainage around the nails/callus is. Denies any swelling redness or drainage.  Denies any acute changes since last appointment and no new complaints today. Denies any systemic complaints such as fevers, chills, nausea, vomiting.   Objective: AAO 3, NAD DP/PT pulses palpable, CRT less than 3 seconds Nails hypertrophic, dystrophic, elongated, brittle, discolored 10. There is tenderness overlying the nails 1-5 bilaterally. There is no surrounding erythema or drainage along the nail sites. Minimal hyperkeratotic tissue right submetatarsal 2.   No surrounding edema, erythema, drainage or pus or any signs of infection noted today.   No open lesions or other pre-ulcerative lesions are identified. No pain with calf compression, swelling, warmth, erythema. Overall, no significant change  Assessment: Symptomatic onychomycosis, hyperkeratotic lesions  Plan: -Treatment options including alternatives, risks, complications were discussed -Nails sharply debrided 10 without complication/bleeding. -Debrided the hyperkeratotic lesion to the any complications or bleeding x 1 -Discussed daily foot inspection. If there are any changes, to call the office immediately.  -Follow-up in 3 months for routine care or sooner if any problems are to arise. In the meantime, encouraged to call the office with any questions, concerns, changes symptoms.  Celesta Gentile, DPM

## 2019-03-03 ENCOUNTER — Ambulatory Visit: Payer: Medicare HMO | Admitting: Podiatry

## 2019-03-04 IMAGING — MR MR HEAD W/O CM
10 series · 48 of 48 positions shown · non-contrast
Comparison: None.

CLINICAL DATA: Dementia and confusion worsening over the last 2
weeks.

EXAM:
MRI HEAD WITHOUT CONTRAST
TECHNIQUE: Multiplanar, multiecho pulse sequences of the brain and surrounding
structures were obtained without intravenous contrast.

[Series 2: t1_se_sag · sagittal · 5.0mm · 0.45mm/px · 3 of 21 slices shown]
[im 1/21]
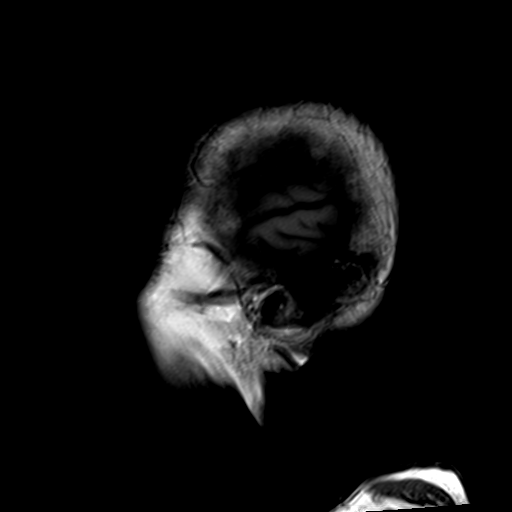
[im 11/21]
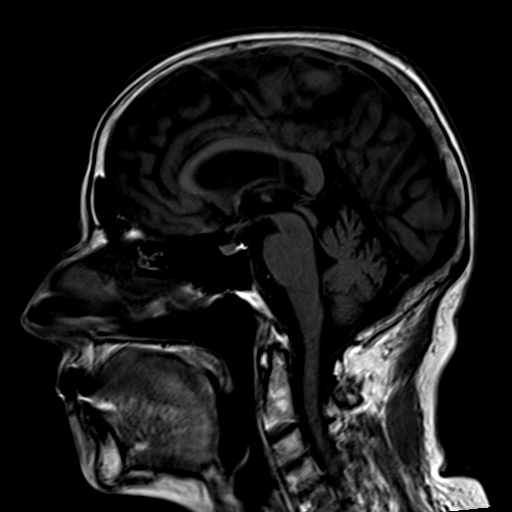
[im 21/21]
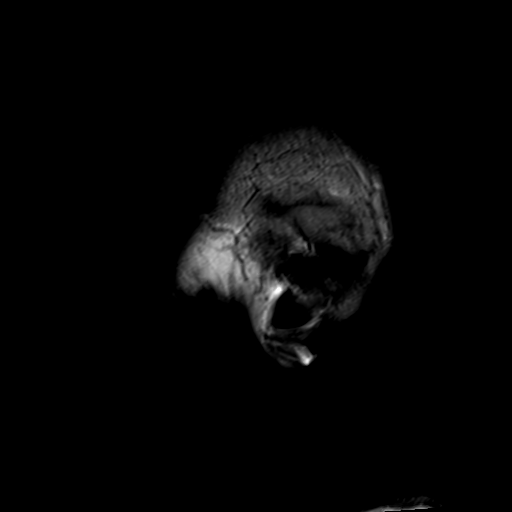

[Series 3: ep2d_diff_(id)_trace · axial · 3.0mm · 1.80mm/px · z∈[-11,+130]mm · 8 of 96 slices shown]
[im 1/96]
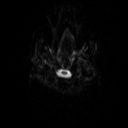
[im 14/96]
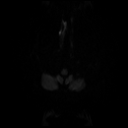
[im 28/96]
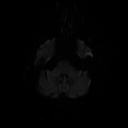
[im 41/96]
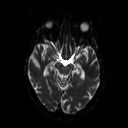
[im 55/96]
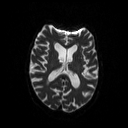
[im 68/96]
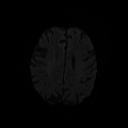
[im 82/96]
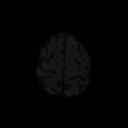
[im 96/96]
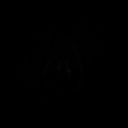

[Series 4: ep2d_diff_(id)_trace_adc · axial · 3.0mm · 1.80mm/px · z∈[-8,+130]mm · 4 of 47 slices shown]
[im 1/47]
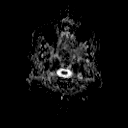
[im 16/47]
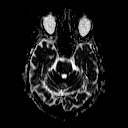
[im 31/47]
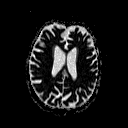
[im 47/47]
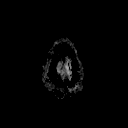

[Series 5: ep2d_diff_cor · coronal · 5.0mm · 1.77mm/px · 4 of 48 slices shown]
[im 1/48]
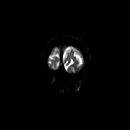
[im 16/48]
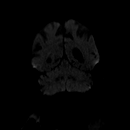
[im 32/48]
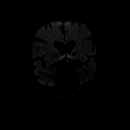
[im 48/48]
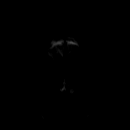

[Series 6: ep2d_diff_cor_adc · coronal · 5.0mm · 1.77mm/px · 2 of 24 slices shown]
[im 1/24]
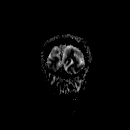
[im 24/24]
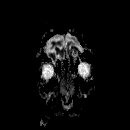

[Series 8: swi_images · axial · 2.0mm · 0.90mm/px · z∈[-19,+139]mm · 7 of 80 slices shown]
[im 1/80]
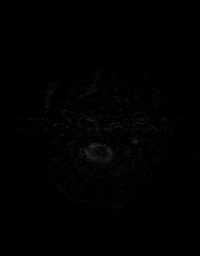
[im 14/80]
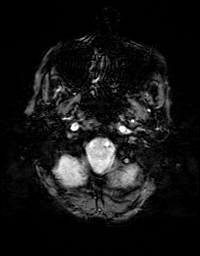
[im 27/80]
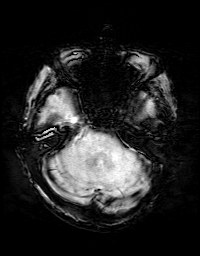
[im 40/80]
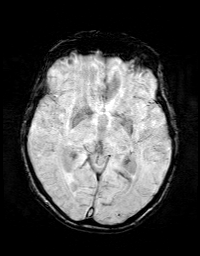
[im 53/80]
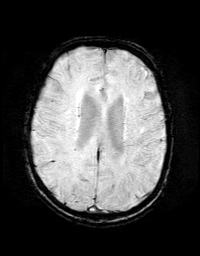
[im 66/80]
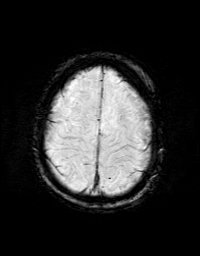
[im 80/80]
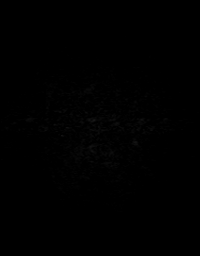

[Series 9: FLAIR · axial · 3.0mm · 0.43mm/px · z∈[-18,+137]mm · 2 of 27 slices shown]
[im 1/27]
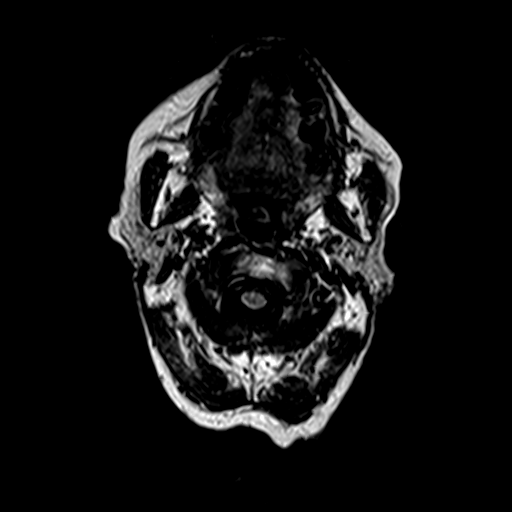
[im 27/27]
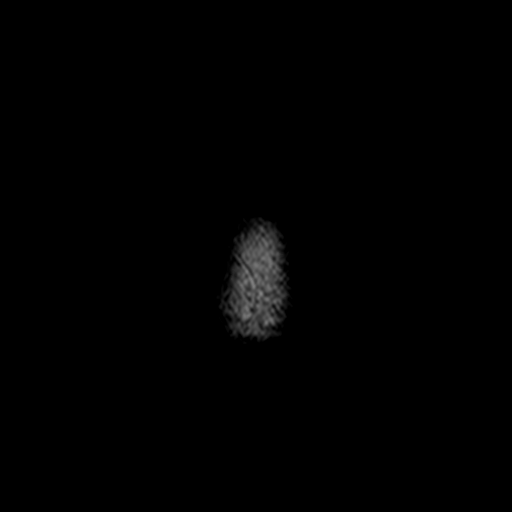

[Series 10: t2_tse_tra_512 · axial · 5.0mm · 0.60mm/px · z∈[-9,+129]mm · 2 of 24 slices shown]
[im 1/24]
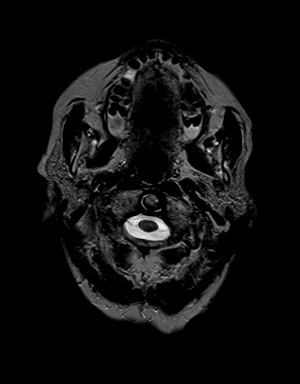
[im 24/24]
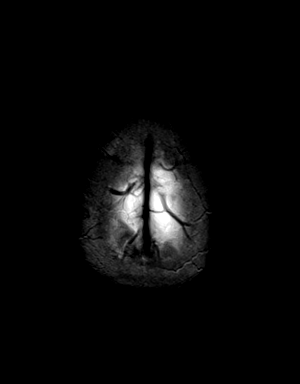

[Series 11: t1_mpr_tra · axial · 1.0mm · 0.72mm/px · z∈[-19,+140]mm · 14 of 160 slices shown]
[im 1/160]
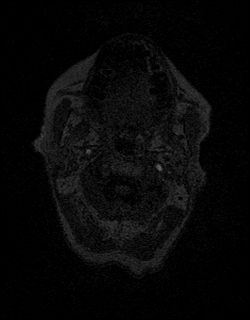
[im 13/160]
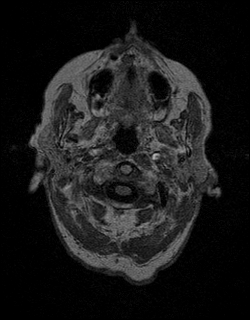
[im 25/160]
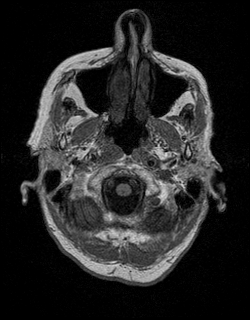
[im 37/160]
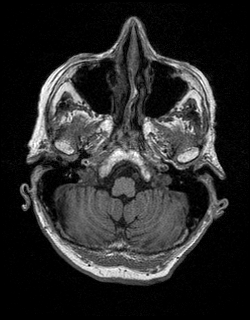
[im 49/160]
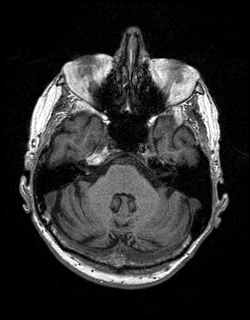
[im 62/160]
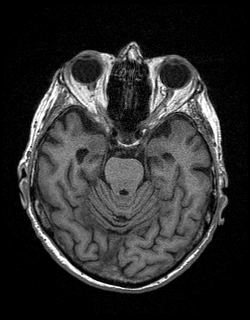
[im 74/160]
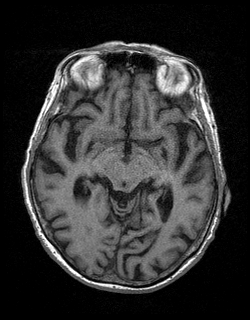
[im 86/160]
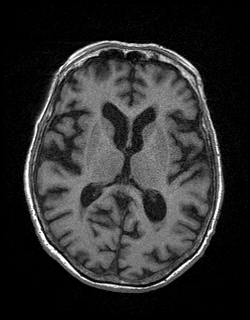
[im 98/160]
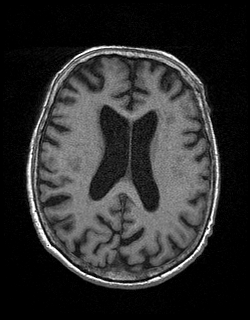
[im 111/160]
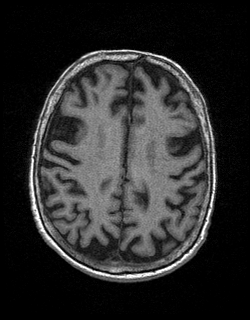
[im 123/160]
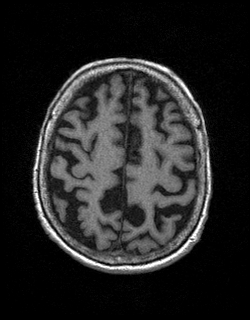
[im 135/160]
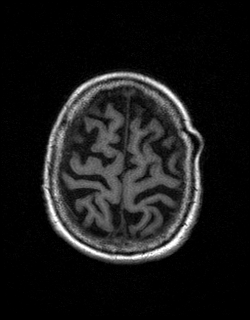
[im 147/160]
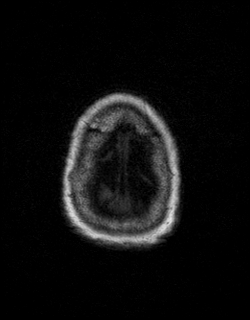
[im 160/160]
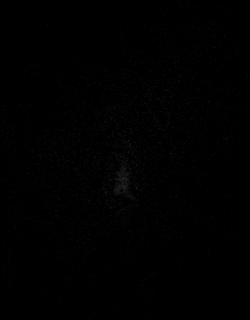

[Series 12: T2 · coronal · 5.0mm · 0.45mm/px · 2 of 26 slices shown]
[im 1/26]
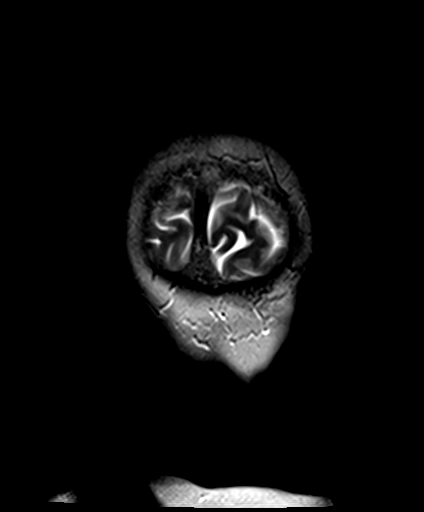
[im 26/26]
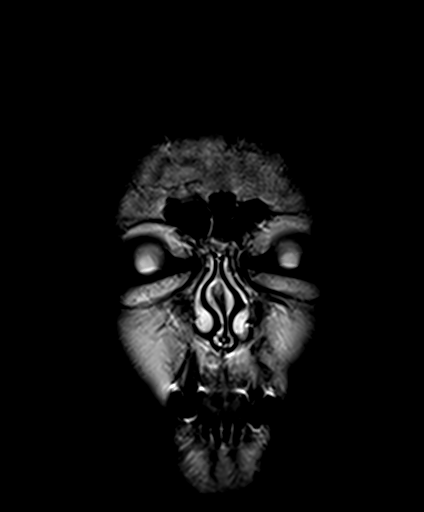

[48 of 48 positions shown; findings below may reference images not displayed]

FINDINGS: Brain: Diffusion imaging does not show any acute or subacute
infarction. The brainstem and cerebellum are normal. Cerebral
hemispheres show moderate generalized atrophy with minimal small
vessel change of the deep white matter. No cortical or large vessel
territory infarction. No lobar predominance. No mass lesion,
hemorrhage, hydrocephalus or extra-axial collection. There are
incidental dilated perivascular spaces at the base of the brain.

Vascular: Major vessels at the base of the brain show flow.

Skull and upper cervical spine: Negative

Sinuses/Orbits: Clear/normal

Other: None
IMPRESSION: No acute or reversible finding. Moderate generalized atrophy without
lobar predominance. Minimal small vessel change of the hemispheric
white matter.

## 2019-05-01 ENCOUNTER — Other Ambulatory Visit: Payer: Self-pay | Admitting: Cardiology

## 2019-09-27 ENCOUNTER — Encounter: Payer: Self-pay | Admitting: Internal Medicine

## 2019-09-27 ENCOUNTER — Other Ambulatory Visit: Payer: Self-pay

## 2019-09-27 ENCOUNTER — Ambulatory Visit: Payer: Medicare HMO | Admitting: Internal Medicine

## 2019-09-27 VITALS — BP 159/86 | HR 84 | Ht 62.0 in | Wt 107.0 lb

## 2019-09-27 DIAGNOSIS — I10 Essential (primary) hypertension: Secondary | ICD-10-CM | POA: Diagnosis not present

## 2019-09-27 DIAGNOSIS — E782 Mixed hyperlipidemia: Secondary | ICD-10-CM

## 2019-09-27 DIAGNOSIS — I428 Other cardiomyopathies: Secondary | ICD-10-CM | POA: Diagnosis not present

## 2019-09-27 NOTE — Patient Instructions (Signed)
Medication Instructions:  Your physician recommends that you continue on your current medications as directed. Please refer to the Current Medication list given to you today.  *If you need a refill on your cardiac medications before your next appointment, please call your pharmacy*  Lab Work: NONE If you have labs (blood work) drawn today and your tests are completely normal, you will receive your results only by: Marland Kitchen MyChart Message (if you have MyChart) OR . A paper copy in the mail If you have any lab test that is abnormal or we need to change your treatment, we will call you to review the results.  Testing/Procedures: Echo @ [address] 3rd Floor  Follow-Up: At [facility], you and your health needs are our priority.  As part of our continuing mission to provide you with exceptional heart care, we have created designated Provider Care Teams.  These Care Teams include your primary Cardiologist (physician) and Advanced Practice Providers (APPs -  Physician Assistants and Nurse Practitioners) who all work together to provide you with the care you need, when you need it.  Your next appointment:   12 month(s)  The format for your next appointment:   Either In Person or Virtual  Provider:   You may see Dr. Debara Pickett or one of the following Advanced Practice Providers on your designated Care Team:    Almyra Deforest, PA-C  Fabian Sharp, [place] or   Roby Lofts, [place]   Other Instructions

## 2019-09-27 NOTE — Progress Notes (Signed)
OFFICE NOTE  Chief Complaint:  Follow-up heart failure  Primary Care Physician: Lajean Manes, MD  HPI:  Dawn Burke is a 85 y.o. female with a past medial history significant for congestive heart failure. It is not known whether this is ischemic or nonischemic. She moved with her family to [place] in 2014 from [place]. She lives in a retirement community. In June 2018 and echo showed an EF of 30-35% with moderate aortic insufficiency and global hypokinesis. She was started on medical therapy for that and saw Kerin Ransom, PA-C. He added carvedilol and she is currently on the moderate dose of Entresto. Subsequently she underwent a repeat echo on 08/11/2017 which showed improvement in LVEF to 45-50%. Unfortunate she also has some dementia and does not have much recollection of her symptoms. Overall she says she feels fine when queried about a number of medical problems. She denies shortness of breath or chest pain. She is not very physically active. Overall she is without complaints.  09/27/2019  Dawn Burke is seen today in follow-up.  Overall she reports no complaints.  Denies any chest pain or worsening shortness of breath.  Last echo showed EF 45 to 50%.  She is a reasonable hypertension control the blood pressure is a little elevated today.  She is on Entresto as well as carvedilol and low-dose Lasix.  She reports NYHA class I symptoms at this time.  PMHx:  Past Medical History:  Diagnosis Date  . Chronic systolic CHF (congestive heart failure) ([place])   . Hyperlipidemia   . Hypertension   . Osteoporosis     No past surgical history on file.  FAMHx:  Family History  Problem Relation Age of Onset  . Diabetes Mother   . CAD Father   . CAD Brother   . Diabetes Brother     SOCHx:   reports that she has never smoked. She has never used smokeless tobacco. She reports current alcohol use. No history on file for drug.  ALLERGIES:  No Known Allergies  ROS: Pertinent  items noted in HPI and remainder of comprehensive ROS otherwise negative.  HOME MEDS: Current Outpatient Medications on File Prior to Visit  Medication Sig Dispense Refill  . aspirin EC 81 MG tablet Take 81 mg by mouth daily.    . carvedilol (COREG) 3.125 MG tablet TAKE 1 TABLET BY MOUTH TWICE A DAY 180 tablet 1  . ENTRESTO 49-51 MG Take 1 tablet by mouth 2 (two) times daily.    . ergocalciferol (VITAMIN D2) 1.25 MG (50000 UT) capsule Take 50,000 Units by mouth once a week.    . furosemide (LASIX) 20 MG tablet Take 20 mg by mouth daily.     No current facility-administered medications on file prior to visit.     LABS/IMAGING: No results found for this or any previous visit (from the past 48 hour(s)). No results found.  LIPID PANEL: No results found for: CHOL, TRIG, HDL, CHOLHDL, VLDL, LDLCALC, LDLDIRECT   WEIGHTS: Wt Readings from Last 3 Encounters:  09/27/19 107 lb (48.5 kg)  08/18/17 102 lb (46.3 kg)  05/13/17 99 lb 3.2 oz (45 kg)    VITALS: BP (!) 159/86   Pulse 84   Ht 5\' 2"  (1.575 m)   Wt 107 lb (48.5 kg)   SpO2 98%   BMI 19.57 kg/m   EXAM: General appearance: alert and no distress Neck: no carotid bruit, no JVD and thyroid not enlarged, symmetric, no tenderness/mass/nodules Lungs: clear to auscultation bilaterally  Heart: regular rate and rhythm, S1, S2 normal and diastolic murmur: early diastolic 2/6, crescendo at 2nd right intercostal space Abdomen: soft, non-tender; bowel sounds normal; no masses,  no organomegaly Extremities: extremities normal, atraumatic, no cyanosis or edema Pulses: 2+ and symmetric Skin: Skin color, texture, turgor normal. No rashes or lesions Neurologic: Grossly normal Psych: Pleasant  EKG: Normal sinus rhythm 84, low voltage QRS-personally reviewed  ASSESSMENT: 1. Systolic congestive heart failure, LVEF 30-35% (improved to 45-50%) 2. Hyperlipidemia 3. Hypertension 4. Memory loss  PLAN: 1.   Dawn Burke reports doing well and is  asymptomatic.  I suspect her EF may have normalized.  We will plan a repeat echo since her last study was 2 years ago.  Blood pressure is elevated today however is better at home.  She is compliant with her medications.  Is been some memory loss but her daughter reports is fairly stable.  We have encouraged continued exercise and physical activity.  Follow-up with me annually or sooner as necessary.  Pixie Casino, MD, [facility], [place] Director of the Advanced Lipid Disorders &  Cardiovascular Risk Reduction Clinic Diplomate of the American Board of Clinical Lipidology Attending Cardiologist  Direct Dial: [phone]  Fax: [phone]  Website:  [email or web address] 09/27/2019, 5:02 PM

## 2019-10-13 ENCOUNTER — Other Ambulatory Visit: Payer: Self-pay

## 2019-10-13 ENCOUNTER — Ambulatory Visit (HOSPITAL_COMMUNITY): Payer: Medicare HMO | Attending: Cardiology

## 2019-10-13 DIAGNOSIS — I428 Other cardiomyopathies: Secondary | ICD-10-CM | POA: Diagnosis not present

## 2019-10-27 ENCOUNTER — Other Ambulatory Visit: Payer: Self-pay | Admitting: Cardiology

## 2020-08-01 ENCOUNTER — Other Ambulatory Visit: Payer: Self-pay | Admitting: Cardiology

## 2020-10-09 ENCOUNTER — Telehealth: Payer: Self-pay

## 2020-10-09 ENCOUNTER — Telehealth (INDEPENDENT_AMBULATORY_CARE_PROVIDER_SITE_OTHER): Payer: Medicare HMO | Admitting: Physician Assistant

## 2020-10-09 ENCOUNTER — Encounter: Payer: Self-pay | Admitting: Physician Assistant

## 2020-10-09 VITALS — BP 153/84

## 2020-10-09 DIAGNOSIS — I509 Heart failure, unspecified: Secondary | ICD-10-CM

## 2020-10-09 DIAGNOSIS — E785 Hyperlipidemia, unspecified: Secondary | ICD-10-CM | POA: Diagnosis not present

## 2020-10-09 DIAGNOSIS — F0391 Unspecified dementia with behavioral disturbance: Secondary | ICD-10-CM

## 2020-10-09 DIAGNOSIS — F0390 Unspecified dementia without behavioral disturbance: Secondary | ICD-10-CM | POA: Diagnosis not present

## 2020-10-09 DIAGNOSIS — I10 Essential (primary) hypertension: Secondary | ICD-10-CM | POA: Diagnosis not present

## 2020-10-09 NOTE — Progress Notes (Signed)
Virtual Visit via Telephone Note   This visit type was conducted due to national recommendations for restrictions regarding the COVID-19 Pandemic (e.g. social distancing) in an effort to limit this patient's exposure and mitigate transmission in our community.  Due to her co-morbid illnesses, this patient is at least at moderate risk for complications without adequate follow up.  This format is felt to be most appropriate for this patient at this time.  The patient did not have access to video technology/had technical difficulties with video requiring transitioning to audio format only (telephone).  All issues noted in this document were discussed and addressed.  No physical exam could be performed with this format.  Please refer to the patient's chart for her  consent to telehealth for [facility].    Date:  10/11/2020   ID:  Dawn Burke, DOB [birth year], MRN [id number] The patient was identified using 2 identifiers.  Patient Location: Home Provider Location: Home Office  PCP:  Lajean Manes, MD  Cardiologist:  Pixie Casino, MD  Electrophysiologist:  None   Evaluation Performed:  Follow-Up Visit  Chief Complaint:  Follow up  History of Present Illness:    Dawn Burke is a 86 y.o. female with past medical history of congestive heart failure, hypertension, hyperlipidemia and dementia.  It was not clear whether this is ischemic or nonischemic cardiomyopathy.  Patient moved to [place] in 2014 from [place].  She lives in a retirement home.  Echocardiogram obtained in June 2018 showed EF 30 to 35% with moderate aortic regurgitation and global hypokinesis.  She was started on medical therapy including carvedilol and Entresto.  Repeat echocardiogram obtained in October 2018 showed EF improved to 45 to 50%.  She was last seen by Dr. Debara Pickett in November 2020 at which time she was doing well and denies any chest discomfort.  I do not see any prior record of ischemic work-up.  Given her  underlying dementia and her advanced age, we opted to medically treat her as long as she does not have acute chest pain. Last echocardiogram obtained on 10/13/2019 showed EF 60 to 65%, asymmetric hypertrophy of the basal septum, small left ventricular internal cavity size, moderate AI.   Patient presents today for virtual visit.  She denies any recent chest discomfort or shortness of breath.  She has no significant lower extremity edema, orthopnea or PND. We will continue to see her on an annual basis.  The patient does not have symptoms concerning for COVID-19 infection (fever, chills, cough, or new shortness of breath).    Past Medical History:  Diagnosis Date  . Chronic systolic CHF (congestive heart failure) ([place])   . Hyperlipidemia   . Hypertension   . Osteoporosis    History reviewed. No pertinent surgical history.   Current Meds  Medication Sig  . aspirin EC 81 MG tablet Take 81 mg by mouth daily.  . carvedilol (COREG) 3.125 MG tablet TAKE 1 TABLET BY MOUTH TWICE A DAY  . ENTRESTO 49-51 MG Take 1 tablet by mouth 2 (two) times daily.  . ergocalciferol (VITAMIN D2) 1.25 MG (50000 UT) capsule Take 50,000 Units by mouth once a week.     Allergies:   Patient has no known allergies.   Social History   Tobacco Use  . Smoking status: Never Smoker  . Smokeless tobacco: Never Used  Vaping Use  . Vaping Use: Never used  Substance Use Topics  . Alcohol use: Yes  . Drug use: Not on file  Family Hx: The patient's family history includes CAD in her brother and father; Diabetes in her brother and mother.  ROS:   Please see the history of present illness.     All other systems reviewed and are negative.   Prior CV studies:   The following studies were reviewed today:  Echo 10/13/2019 IMPRESSIONS    1. Left ventricular ejection fraction, by visual estimation, is 60 to  65%. The left ventricle has normal function. There is moderately increased  left ventricular  hypertrophy.  2. Asymmetric hypertrophy measuring 59mm in basal septum (60mm in  posterior wall)  3. Left ventricular diastolic parameters are indeterminate.  4. Small left ventricular internal cavity size.  5. Global right ventricle has normal systolic function.The right  ventricular size is normal. No increase in right ventricular wall  thickness.  6. Left atrial size was normal.  7. Right atrial size was normal.  8. The mitral valve is normal in structure. No evidence of mitral valve  regurgitation.  9. The tricuspid valve is normal in structure. Tricuspid valve  regurgitation is trivial.  10. The aortic valve is tricuspid. Aortic valve regurgitation is moderate.  No evidence of aortic valve sclerosis or stenosis.  11. The pulmonic valve was not well visualized. Pulmonic valve  regurgitation is not visualized.  12. Normal pulmonary artery systolic pressure.  75. Compared to prior echo 08/11/17, improvement in LV systolic function  Labs/Other Tests and Data Reviewed:    EKG:  An ECG dated 09/27/2019 was personally reviewed today and demonstrated:  Sinus rhythm with poor R wave progression in the anterior leads.  Recent Labs: No results found for requested labs within last 8760 hours.   Recent Lipid Panel No results found for: CHOL, TRIG, HDL, CHOLHDL, LDLCALC, LDLDIRECT  Wt Readings from Last 3 Encounters:  09/27/19 107 lb (48.5 kg)  08/18/17 102 lb (46.3 kg)  05/13/17 99 lb 3.2 oz (45 kg)     Risk Assessment/Calculations:      Objective:    Vital Signs:  BP (!) 153/84    VITAL SIGNS:  reviewed  ASSESSMENT & PLAN:    1. History of congestive heart failure: It is unclear whether she has ischemic versus nonischemic cardiomyopathy, however fortunately her ejection fraction has normalized on last echocardiogram in December 2020.  We will continue with carvedilol and Entresto.  2. Hypertension: Blood pressure mildly elevated today, continue current therapy.  Avoid  aggressive up titration of blood pressure medication given her advanced age.  3. Hyperlipidemia: She is not on any statin.  I did not try to add a statin given her advanced age  85. Dementia: Cared for by her daughter.   Shared Decision Making/Informed Consent        COVID-19 Education: The signs and symptoms of COVID-19 were discussed with the patient and how to seek care for testing (follow up with PCP or arrange E-visit).  The importance of social distancing was discussed today.  Time:   Today, I have spent 8 minutes with the patient with telehealth technology discussing the above problems.     Medication Adjustments/Labs and Tests Ordered: Current medicines are reviewed at length with the patient today.  Concerns regarding medicines are outlined above.   Tests Ordered: No orders of the defined types were placed in this encounter.   Medication Changes: No orders of the defined types were placed in this encounter.   Follow Up:  In Person in 1 year(s)  Signed, Almyra Deforest, [place]  10/11/2020  10:24 PM    [facility]

## 2020-10-09 NOTE — Telephone Encounter (Signed)
°  Patient Consent for Virtual Visit         Dawn Burke has provided verbal consent on 10/09/2020 for a virtual visit (video or telephone).   CONSENT FOR VIRTUAL VISIT FOR:  Dawn Burke  By participating in this virtual visit I agree to the following:  I hereby voluntarily request, consent and authorize [facility] and its employed or contracted physicians, physician assistants, nurse practitioners or other licensed health care professionals (the Practitioner), to provide me with telemedicine health care services (the Services") as deemed necessary by the treating Practitioner. I acknowledge and consent to receive the Services by the Practitioner via telemedicine. I understand that the telemedicine visit will involve communicating with the Practitioner through live audiovisual communication technology and the disclosure of certain medical information by electronic transmission. I acknowledge that I have been given the opportunity to request an in-person assessment or other available alternative prior to the telemedicine visit and am voluntarily participating in the telemedicine visit.  I understand that I have the right to withhold or withdraw my consent to the use of telemedicine in the course of my care at any time, without affecting my right to future care or treatment, and that the Practitioner or I may terminate the telemedicine visit at any time. I understand that I have the right to inspect all information obtained and/or recorded in the course of the telemedicine visit and may receive copies of available information for a reasonable fee.  I understand that some of the potential risks of receiving the Services via telemedicine include:   Delay or interruption in medical evaluation due to technological equipment failure or disruption;  Information transmitted may not be sufficient (e.g. poor resolution of images) to allow for appropriate medical decision making by the Practitioner;  and/or   In rare instances, security protocols could fail, causing a breach of personal health information.  Furthermore, I acknowledge that it is my responsibility to provide information about my medical history, conditions and care that is complete and accurate to the best of my ability. I acknowledge that Practitioner's advice, recommendations, and/or decision may be based on factors not within their control, such as incomplete or inaccurate data provided by me or distortions of diagnostic images or specimens that may result from electronic transmissions. I understand that the practice of medicine is not an exact science and that Practitioner makes no warranties or guarantees regarding treatment outcomes. I acknowledge that a copy of this consent can be made available to me via my patient portal ([place]), or I can request a printed copy by calling the office of [facility].    I understand that my insurance will be billed for this visit.   I have read or had this consent read to me.  I understand the contents of this consent, which adequately explains the benefits and risks of the Services being provided via telemedicine.   I have been provided ample opportunity to ask questions regarding this consent and the Services and have had my questions answered to my satisfaction.  I give my informed consent for the services to be provided through the use of telemedicine in my medical care

## 2020-10-09 NOTE — Patient Instructions (Signed)
Other Instructions:  Check blood pressure (BP) 2-3 times over the next few weeks. If systolic (Top) number is consistently above 150 and heart rate (HR) above 60 increase Carvedilol (Coreg) to 6.25 mg 2 times a day. If Systolic (Top) number is less than 150 and heart rate (HR) less than 60 keep Carvedilol (Coreg) at current 3.125 mg 2 times a day.   Medication Instructions:  Your physician recommends that you continue on your current medications as directed. Please refer to the Current Medication list given to you today.  *If you need a refill on your cardiac medications before your next appointment, please call your pharmacy*  Lab Work: NONE ordered at this time of appointment   If you have labs (blood work) drawn today and your tests are completely normal, you will receive your results only by: Marland Kitchen MyChart Message (if you have MyChart) OR . A paper copy in the mail If you have any lab test that is abnormal or we need to change your treatment, we will call you to review the results.  Testing/Procedures: NONE ordered at this time of appointment   Follow-Up: At [facility], you and your health needs are our priority.  As part of our continuing mission to provide you with exceptional heart care, we have created designated Provider Care Teams.  These Care Teams include your primary Cardiologist (physician) and Advanced Practice Providers (APPs -  Physician Assistants and Nurse Practitioners) who all work together to provide you with the care you need, when you need it.  We recommend signing up for the patient portal called "MyChart".  Sign up information is provided on this After Visit Summary.  MyChart is used to connect with patients for Virtual Visits (Telemedicine).  Patients are able to view lab/test results, encounter notes, upcoming appointments, etc.  Non-urgent messages can be sent to your provider as well.   To learn more about what you can do with MyChart, go to  [email or web address].    Your next appointment:   1 year(s)  The format for your next appointment:   In Person  Provider:   K. Mali Hilty, MD

## 2020-10-11 ENCOUNTER — Encounter: Payer: Self-pay | Admitting: Physician Assistant

## 2021-04-14 ENCOUNTER — Encounter: Payer: Self-pay | Admitting: Podiatry

## 2021-04-14 ENCOUNTER — Other Ambulatory Visit: Payer: Self-pay

## 2021-04-14 ENCOUNTER — Ambulatory Visit: Payer: Medicare HMO | Admitting: Podiatry

## 2021-04-14 DIAGNOSIS — M79674 Pain in right toe(s): Secondary | ICD-10-CM

## 2021-04-14 DIAGNOSIS — L84 Corns and callosities: Secondary | ICD-10-CM

## 2021-04-14 DIAGNOSIS — B351 Tinea unguium: Secondary | ICD-10-CM | POA: Diagnosis not present

## 2021-04-14 DIAGNOSIS — M79675 Pain in left toe(s): Secondary | ICD-10-CM | POA: Diagnosis not present

## 2021-04-18 NOTE — Progress Notes (Signed)
  Subjective:  Patient ID: Dawn Burke, female    DOB: [birth year],  MRN: [id number]  Dawn Burke presents to clinic today for callus(es) right foot and painful thick toenails that are difficult to trim. Painful toenails interfere with ambulation. Aggravating factors include wearing enclosed shoe gear. Pain is relieved with periodic professional debridement. Painful calluses are aggravated when weightbearing with and without shoegear. Pain is relieved with periodic professional debridement.  PCP is Dr. Lajean Manes.  No Known Allergies  Review of Systems: Negative except as noted in the HPI. Objective:   Constitutional Dawn Burke is a pleasant 87 y.o. Caucasian female, in NAD. AAO x 3.   Vascular Capillary fill time to digits <3 seconds b/l lower extremities. Palpable pedal pulses b/l LE. Pedal hair sparse. Lower extremity skin temperature gradient within normal limits. No pain with calf compression b/l. No edema noted b/l lower extremities. No cyanosis or clubbing noted.  Neurologic Normal speech. Oriented to person, place, and time. Protective sensation intact 5/5 intact bilaterally with 10g monofilament b/l.  Dermatologic Pedal skin with normal turgor, texture and tone bilaterally. No open wounds bilaterally. No interdigital macerations bilaterally. Toenails 1-5 b/l elongated, discolored, dystrophic, thickened, crumbly with subungual debris and tenderness to dorsal palpation. Hyperkeratotic lesion(s) submet head 2 right foot.  No erythema, no edema, no drainage, no fluctuance.  Orthopedic: Normal muscle strength 5/5 to all lower extremity muscle groups bilaterally. No pain crepitus or joint limitation noted with ROM b/l. No gross bony deformities bilaterally.   Radiographs: None Assessment:   1. Pain due to onychomycosis of toenails of both feet   2. Callus    Plan:  Patient was evaluated and treated and all questions answered.  Onychomycosis with pain -Nails palliatively  debridement as below -Educated on self-care  Procedure: Nail Debridement Rationale: Pain Type of Debridement: manual, sharp debridement. Instrumentation: Nail nipper, rotary burr. Number of Nails: 10 -Examined patient. -Patient to continue soft, supportive shoe gear daily. -Toenails 1-5 b/l were debrided in length and girth with sterile nail nippers and dremel without iatrogenic bleeding.  -Callus(es) submet head 2 right foot pared utilizing sterile scalpel blade without complication or incident. Total number debrided =1. -Patient to report any pedal injuries to medical professional immediately. -Patient/POA to call should there be question/concern in the interim.  Return in about 3 months (around 07/15/2021).  Marzetta Board, DPM

## 2021-07-22 ENCOUNTER — Ambulatory Visit: Payer: Medicare HMO | Admitting: Podiatry

## 2021-07-22 ENCOUNTER — Other Ambulatory Visit: Payer: Self-pay

## 2021-07-22 DIAGNOSIS — M79674 Pain in right toe(s): Secondary | ICD-10-CM | POA: Diagnosis not present

## 2021-07-22 DIAGNOSIS — B351 Tinea unguium: Secondary | ICD-10-CM | POA: Diagnosis not present

## 2021-07-22 DIAGNOSIS — Q828 Other specified congenital malformations of skin: Secondary | ICD-10-CM | POA: Diagnosis not present

## 2021-07-22 DIAGNOSIS — M79671 Pain in right foot: Secondary | ICD-10-CM | POA: Diagnosis not present

## 2021-07-22 DIAGNOSIS — M79675 Pain in left toe(s): Secondary | ICD-10-CM

## 2021-07-27 ENCOUNTER — Encounter: Payer: Self-pay | Admitting: Podiatry

## 2021-07-27 NOTE — Progress Notes (Signed)
  Subjective:  Patient ID: Dawn Burke, female    DOB: 03-14-1926,  MRN: [id number]  Dawn Burke presents to clinic today for callus(es) plantar aspect of right foot and painful thick toenails that are difficult to trim. Painful toenails interfere with ambulation. Aggravating factors include wearing enclosed shoe gear. Pain is relieved with periodic professional debridement. Painful calluses are aggravated when weightbearing with and without shoegear. Pain is relieved with periodic professional debridement.  She voices no new pedal concerns on today's visit.  PCP is Lajean Manes, MD , and last visit was 07/22/2021  No Known Allergies  Review of Systems: Negative except as noted in the HPI. Objective:   Constitutional Dawn Burke is a pleasant 87 y.o. Caucasian female, WD, WN in NAD. AAO x 3.   Vascular Capillary fill time to digits <3 seconds b/l lower extremities. Palpable DP pulse(s) b/l lower extremities Palpable PT pulse(s) b/l lower extremities Pedal hair sparse. Lower extremity skin temperature gradient within normal limits. No edema noted b/l lower extremities. No cyanosis or clubbing noted.  Neurologic Normal speech. Oriented to person, place, and time. Protective sensation intact 5/5 intact bilaterally with 10g monofilament b/l. Vibratory sensation intact b/l.  Dermatologic Skin warm and supple b/l lower extremities. No open wounds b/l lower extremities. No interdigital macerations b/l lower extremities. Toenails 1-5 b/l elongated, discolored, dystrophic, thickened, crumbly with subungual debris and tenderness to dorsal palpation. Porokeratotic lesion(s) submet head 2 right foot. No erythema, no edema, no drainage, no fluctuance.  Orthopedic: Normal muscle strength 5/5 to all lower extremity muscle groups bilaterally. No pain crepitus or joint limitation noted with ROM b/l lower extremities. No gross bony deformities b/l lower extremities.   Radiographs: None Assessment:   1.  Pain due to onychomycosis of toenails of both feet   2. Porokeratosis   3. Pain in right foot    Plan:  Patient was evaluated and treated and all questions answered. Consent given for treatment as described below: -Examined patient. -Patient to continue soft, supportive shoe gear daily. -Toenails 1-5 b/l were debrided in length and girth with sterile nail nippers and dremel without iatrogenic bleeding.  -Painful porokeratotic lesion(s) submet head 2 right foot pared and enucleated with sterile scalpel blade without incident. Total number of lesions debrided=1. -Patient to report any pedal injuries to medical professional immediately. -Patient/POA to call should there be question/concern in the interim.  Return in about 3 months (around 10/21/2021).  Marzetta Board, DPM/

## 2021-10-24 ENCOUNTER — Encounter: Payer: Self-pay | Admitting: Podiatry

## 2021-10-24 ENCOUNTER — Other Ambulatory Visit: Payer: Self-pay

## 2021-10-24 ENCOUNTER — Ambulatory Visit: Payer: Medicare HMO | Admitting: Podiatry

## 2021-10-24 DIAGNOSIS — Q828 Other specified congenital malformations of skin: Secondary | ICD-10-CM

## 2021-10-24 DIAGNOSIS — M79674 Pain in right toe(s): Secondary | ICD-10-CM | POA: Diagnosis not present

## 2021-10-24 DIAGNOSIS — M79675 Pain in left toe(s): Secondary | ICD-10-CM | POA: Diagnosis not present

## 2021-10-24 DIAGNOSIS — M79671 Pain in right foot: Secondary | ICD-10-CM

## 2021-10-24 DIAGNOSIS — B351 Tinea unguium: Secondary | ICD-10-CM

## 2021-10-29 DIAGNOSIS — E441 Mild protein-calorie malnutrition: Secondary | ICD-10-CM | POA: Insufficient documentation

## 2021-10-29 DIAGNOSIS — F0280 Dementia in other diseases classified elsewhere without behavioral disturbance: Secondary | ICD-10-CM | POA: Insufficient documentation

## 2021-10-29 DIAGNOSIS — N2581 Secondary hyperparathyroidism of renal origin: Secondary | ICD-10-CM | POA: Insufficient documentation

## 2021-10-29 DIAGNOSIS — N184 Chronic kidney disease, stage 4 (severe): Secondary | ICD-10-CM | POA: Insufficient documentation

## 2021-10-29 DIAGNOSIS — I129 Hypertensive chronic kidney disease with stage 1 through stage 4 chronic kidney disease, or unspecified chronic kidney disease: Secondary | ICD-10-CM | POA: Insufficient documentation

## 2021-10-29 NOTE — Progress Notes (Signed)
Subjective: Dawn Burke is a 87 y.o. female patient seen today for follow up of painful porokeratotic lesion right foot and painful thick toenails that are difficult to trim. Pain interferes with ambulation. Aggravating factors include wearing enclosed shoe gear. Pain is relieved with periodic professional debridement.  New problems reported today: None.  PCP is Lajean Manes, MD. Last visit was: 11/21/2020.  No Known Allergies  Objective: Physical Exam  General: Patient is a pleasant 87 y.o. Caucasian female WD, WN in NAD. AAO x 3.   Neurovascular Examination: CFT immediate b/l LE. Palpable DP/PT pulses b/l LE. Digital hair sparse b/l. Skin temperature gradient WNL b/l. No pain with calf compression b/l. No edema noted b/l. No cyanosis or clubbing noted b/l LE.  Protective sensation intact 5/5 intact bilaterally with 10g monofilament b/l. Vibratory sensation intact b/l.  Dermatological:  Pedal integument with normal turgor, texture and tone b/l LE. No open wounds b/l. No interdigital macerations b/l. Toenails 1-5 b/l elongated, thickened, discolored with subungual debris. +Tenderness with dorsal palpation of nailplates. Porokeratotic lesion(s) noted submet head 2 right foot.  Musculoskeletal:  Muscle strength 5/5 to all lower extremity muscle groups bilaterally. No pain, crepitus or joint limitation noted with ROM bilateral LE. No gross bony deformities bilaterally.  Assessment: 1. Pain due to onychomycosis of toenails of both feet   2. Porokeratosis   3. Pain in right foot    Plan: Patient was evaluated and treated and all questions answered. Consent given for treatment as described below: -Examined patient. -No new findings. No new orders. -Mycotic toenails 1-5 bilaterally were debrided in length and girth with sterile nail nippers and dremel without incident. -Painful porokeratotic lesion(s) submet head 2 right foot pared and enucleated with sterile scalpel blade without  incident. Total number of lesions debrided=1. -Patient/POA to call should there be question/concern in the interim.  Return in about 3 months (around 01/22/2022).  Marzetta Board, DPM

## 2022-01-28 ENCOUNTER — Ambulatory Visit: Payer: Medicare HMO | Admitting: Podiatry

## 2022-03-04 ENCOUNTER — Ambulatory Visit: Payer: Medicare HMO | Admitting: Podiatry

## 2022-11-27 DIAGNOSIS — I10 Essential (primary) hypertension: Secondary | ICD-10-CM | POA: Diagnosis not present

## 2022-11-27 DIAGNOSIS — I509 Heart failure, unspecified: Secondary | ICD-10-CM | POA: Diagnosis not present

## 2022-11-27 DIAGNOSIS — R52 Pain, unspecified: Secondary | ICD-10-CM | POA: Diagnosis not present

## 2022-11-27 DIAGNOSIS — F0390 Unspecified dementia without behavioral disturbance: Secondary | ICD-10-CM | POA: Diagnosis not present

## 2022-11-27 DIAGNOSIS — Z681 Body mass index (BMI) 19 or less, adult: Secondary | ICD-10-CM

## 2023-01-08 DIAGNOSIS — F0390 Unspecified dementia without behavioral disturbance: Secondary | ICD-10-CM | POA: Diagnosis not present

## 2023-01-08 DIAGNOSIS — Z681 Body mass index (BMI) 19 or less, adult: Secondary | ICD-10-CM

## 2023-01-08 DIAGNOSIS — I10 Essential (primary) hypertension: Secondary | ICD-10-CM | POA: Diagnosis not present

## 2023-01-08 DIAGNOSIS — R52 Pain, unspecified: Secondary | ICD-10-CM | POA: Diagnosis not present

## 2023-01-08 DIAGNOSIS — I509 Heart failure, unspecified: Secondary | ICD-10-CM | POA: Diagnosis not present

## 2023-04-21 DIAGNOSIS — R2689 Other abnormalities of gait and mobility: Secondary | ICD-10-CM | POA: Diagnosis not present

## 2023-04-21 DIAGNOSIS — I10 Essential (primary) hypertension: Secondary | ICD-10-CM | POA: Diagnosis not present

## 2023-04-21 DIAGNOSIS — N390 Urinary tract infection, site not specified: Secondary | ICD-10-CM | POA: Diagnosis not present

## 2023-05-12 DIAGNOSIS — D649 Anemia, unspecified: Secondary | ICD-10-CM

## 2023-05-12 DIAGNOSIS — N1832 Chronic kidney disease, stage 3b: Secondary | ICD-10-CM

## 2023-05-12 DIAGNOSIS — N390 Urinary tract infection, site not specified: Secondary | ICD-10-CM

## 2023-06-18 ENCOUNTER — Emergency Department (HOSPITAL_COMMUNITY)
Admission: EM | Admit: 2023-06-18 | Discharge: 2023-07-11 | Disposition: E | Payer: Medicare HMO | Source: Home / Self Care | Attending: Emergency Medicine | Admitting: Emergency Medicine

## 2023-06-18 DIAGNOSIS — F0390 Unspecified dementia without behavioral disturbance: Secondary | ICD-10-CM | POA: Diagnosis not present

## 2023-06-18 DIAGNOSIS — Z7982 Long term (current) use of aspirin: Secondary | ICD-10-CM | POA: Diagnosis not present

## 2023-06-18 DIAGNOSIS — I509 Heart failure, unspecified: Secondary | ICD-10-CM | POA: Diagnosis not present

## 2023-06-18 DIAGNOSIS — R799 Abnormal finding of blood chemistry, unspecified: Secondary | ICD-10-CM | POA: Diagnosis not present

## 2023-06-18 DIAGNOSIS — R54 Age-related physical debility: Secondary | ICD-10-CM | POA: Diagnosis not present

## 2023-06-18 DIAGNOSIS — E876 Hypokalemia: Secondary | ICD-10-CM | POA: Diagnosis not present

## 2023-06-18 DIAGNOSIS — Z7689 Persons encountering health services in other specified circumstances: Secondary | ICD-10-CM | POA: Diagnosis not present

## 2023-06-18 LAB — CBG MONITORING, ED: Glucose-Capillary: 159 mg/dL — ABNORMAL HIGH (ref 70–99)

## 2023-07-11 DIAGNOSIS — 419620001 Death: Secondary | SNOMED CT | POA: Insufficient documentation

## 2023-07-11 NOTE — ED Notes (Signed)
Pt transported to the morgue  

## 2023-07-11 NOTE — ED Notes (Signed)
Pt began to brady down in room, MD was present and Time of death was called at [employer]

## 2023-07-11 NOTE — ED Provider Notes (Signed)
[place] EMERGENCY DEPARTMENT AT [facility] Provider Note   CSN: [id number] Arrival date & time: 06/25/2023  1816     History  Chief Complaint  Patient presents with   Abnormal Lab    Dawn Burke is a 89 y.o. female.  HPI Patient is brought from [facility] for normal lab values and decreased level consciousness.  Arrival, patient is moribund in appearance.  Patient is breathing spontaneously slow deep respirations.  She is not in any distress.  Based on patient's appearance on arrival suggestive of transitioning to natural death, I contacted the patient's daughter by phone and discussed my observations.  Patient's daughter reports that the patient would not want extreme measures and resuscitation.  Medical history obtained from EMR review. From [facility] note 09/27/2019 by Dr. Rennis Golden: Dawn Burke is a 89 y.o. female with a past medial history significant for congestive heart failure. It is not known whether this is ischemic or nonischemic. She moved with her family to [place] in 2014 from [place]. She lives in a retirement community. In June 2018 and echo showed an EF of 30-35% with moderate aortic insufficiency and global hypokinesis. She was started on medical therapy for that and saw Corine Shelter, PA-C. He added carvedilol and she is currently on the moderate dose of Entresto. Subsequently she underwent a repeat echo on 08/11/2017 which showed improvement in LVEF to 45-50%. Unfortunate she also has some dementia and does not have much recollection of her symptoms     Home Medications Prior to Admission medications   Medication Sig Start Date End Date Taking? Authorizing Provider  aspirin EC 81 MG tablet Take 81 mg by mouth daily.    [provider]  carvedilol (COREG) 3.125 MG tablet TAKE 1 TABLET BY MOUTH TWICE A DAY 08/01/20   Hilty, Lisette Abu, MD  ENTRESTO 49-51 MG Take 1 tablet by mouth 2 (two) times daily. 07/19/17   [provider]   ergocalciferol (VITAMIN D2) 1.25 MG (50000 UT) capsule Take 50,000 Units by mouth once a week.    [provider]      Allergies    Patient has no known allergies.    Review of Systems   Review of Systems  Physical Exam Updated Vital Signs BP 106/89 (BP Location: Left Arm)   Pulse (!) 48   Resp (!) 0   Ht 5\' 2"  (1.575 m)   Wt 48.5 kg   SpO2 (!) 83%   BMI 19.56 kg/m  Physical Exam Constitutional:      Comments: Patient has slow deep respirations.  Eyes are half closed and glazed.  Mucous membranes are dry.  HENT:     Mouth/Throat:     Pharynx: Oropharynx is clear.  Cardiovascular:     Rate and Rhythm: Normal rate.     Comments: Central pulses are distant and regular. Pulmonary:     Comments: Patient does not exhibit respiratory distress.  There are some soft breath sounds and occasional rhonchi. Abdominal:     General: There is no distension.     Palpations: Abdomen is soft.     Comments: Patient has tubing and needle apparatus for subcu fluids on abdominal wall.  Musculoskeletal:     Comments: Extremities are thin and cool with livedo reticularis developing on the lower extremities and distal arms.  I do not appreciate distal pulses.  Skin:    General: Skin is warm and dry.     Coloration: Skin is pale.  Neurological:  Comments: Patient does not have any pain response.  She is not having any spontaneous movements except slow deep respirations.     ED Results / Procedures / Treatments   Labs (all labs ordered are listed, but only abnormal results are displayed) Labs Reviewed  CBG MONITORING, ED - Abnormal; Notable for the following components:      Result Value   Glucose-Capillary 159 (*)    All other components within normal limits  I-STAT CHEM 8, ED    EKG None  Radiology No results found.  Procedures Procedures    Medications Ordered in ED Medications - No data to display  ED Course/ Medical Decision Making/ A&P                                  Medical Decision Making On arrival, patient appears moribund but no distress.  Respirations are nonlabored.  Eyes are half mast slightly glazed.  Extremities are cool to touch.  There are thready central pulses but no palpable peripheral pulses.  I reviewed the patient's condition with the patient's daughter on the phone.  We confirmed that patient did not want resuscitation measures.  Patient's daughter arrived to the room just before patient had passed but heart rate was becoming bradycardic and patient expired just shortly after her daughter arrived to the room.  Patient did not appear to have any distress or pain.  Natural death  Medical examiner Marcelle Smiling Bullins advised patient's death is natural appearance with no suspicion for suspicious circumstances.  Dr. Eloisa Northern is identified PCP.  Voicemail message was left regarding patient's death and need for signature for death certificate.        Final Clinical Impression(s) / ED Diagnoses Final diagnoses:  Death due to natural causes  Advanced age    Rx / DC Orders ED Discharge Orders     None         Arby Barrette, MD 06/28/2023 2243

## 2023-07-11 NOTE — ED Triage Notes (Signed)
Pt BIB EMS from [facility] for abnormal lab values at doctor, on arrival pt GCS 3. Pulses strong, agonal breathing.

## 2023-07-11 NOTE — Progress Notes (Addendum)
   06/16/2023 2005  Spiritual Encounters  Care provided to: [facility] partners present during encounter Nurse  Referral source Nurse (RN/NT/LPN)  Reason for visit End-of-life  OnCall Visit Yes   Received call to report to ER bed 32 as patient transitioned. Daughter Antonietta Jewel was present. Provided spiritual care to daughter and prayer. Listened as daughter shared family dynamics and engaged in story telling. Patient's spouse died 20 years ago. Daughter believes they are together again and finds comfort in such. Daughter's husband was not present as he was home due to having complications with having one remaining lung. Provided daughter with a list of local funeral homes to provide services as daughter wants cremation. Daughter wants to discuss with her husband before making a choice, however she will notify the hospital and chaplain office who she wishes the remains to be released to. Patient also had two estranged son, daughter will try to local and inform them of the patient's death.

## 2023-07-11 DEATH — deceased
